# Patient Record
Sex: Female | Born: 1995 | Race: White | Hispanic: No | Marital: Single | Smoking: Never smoker
Health system: Southern US, Academic
[De-identification: ages and names within clinical notes are randomized; demographics above are authoritative.]

## PROBLEM LIST (undated history)

## (undated) DIAGNOSIS — F4310 Post-traumatic stress disorder, unspecified: Secondary | ICD-10-CM

## (undated) DIAGNOSIS — F319 Bipolar disorder, unspecified: Secondary | ICD-10-CM

## (undated) DIAGNOSIS — Z8701 Personal history of pneumonia (recurrent): Secondary | ICD-10-CM

## (undated) HISTORY — DX: Personal history of pneumonia (recurrent): Z87.01

---

## 2017-12-07 ENCOUNTER — Other Ambulatory Visit: Payer: Self-pay

## 2017-12-07 ENCOUNTER — Emergency Department (HOSPITAL_COMMUNITY)
Admission: EM | Admit: 2017-12-07 | Discharge: 2017-12-08 | Disposition: A | Discharge status: Home / Self Care | Payer: Managed Care, Other (non HMO) | Attending: Emergency Medicine | Admitting: Emergency Medicine

## 2017-12-07 ENCOUNTER — Emergency Department (HOSPITAL_COMMUNITY): Payer: Managed Care, Other (non HMO)

## 2017-12-07 DIAGNOSIS — R0602 Shortness of breath: Secondary | ICD-10-CM

## 2017-12-07 DIAGNOSIS — R079 Chest pain, unspecified: Secondary | ICD-10-CM | POA: Diagnosis present

## 2017-12-07 DIAGNOSIS — J189 Pneumonia, unspecified organism: Secondary | ICD-10-CM

## 2017-12-07 MED ORDER — IPRATROPIUM-ALBUTEROL 0.5-2.5 (3) MG/3ML IN SOLN
3.0000 mL | Freq: Once | RESPIRATORY_TRACT | Status: AC
  Administered 2017-12-07: 3 mL via RESPIRATORY_TRACT
  Filled 2017-12-07: qty 3

## 2017-12-07 MED ORDER — IBUPROFEN 200 MG PO TABS
600.0000 mg | ORAL_TABLET | Freq: Once | ORAL | Status: AC
  Administered 2017-12-07: 600 mg via ORAL
  Filled 2017-12-07: qty 3

## 2017-12-07 MED ORDER — ALBUTEROL SULFATE (2.5 MG/3ML) 0.083% IN NEBU: 5.0000 mg | INHALATION_SOLUTION | Freq: Once | RESPIRATORY_TRACT | Status: DC

## 2017-12-07 MED ORDER — SODIUM CHLORIDE 0.9 % IV BOLUS
1000.0000 mL | Freq: Once | INTRAVENOUS | Status: AC
  Administered 2017-12-08: 1000 mL via INTRAVENOUS

## 2017-12-07 NOTE — ED Provider Notes (Signed)
Care assumed from previous provider PA PenningtonMitchell. Please see note for further details. Case discussed, plan agreed upon.  Briefly, patient is a 22 year old female who presents to emergency department today for shortness of breath and pleuritic chest pain. Will follow up on pending lab work and reevaluate following fluids / neb treatment.  If all reassuring, likely discharged to home with symptomatic management instructions and to continue antibiotics given in urgent care yesterday.  Labs reviewed.  Leukocytosis of 12.7.  Elevated d-dimer at 1.21.  Discussed this with patient will proceed with CT scan.  CT angios with no pulmonary emboli, but does show multifocal airspace opacities pneumonia versus atelectasis.  Given symptoms, will treat for pneumonia. Patient on azithromycin prescribed by urgent care yesterday. Will add amoxil.   Strongly encouraged primary care follow-up.  Reasons to return to ER were discussed and all questions answered.   Ward, Chase PicketJaime Pilcher, PA-C 12/08/17 [id number]    Dione BoozeGlick, David, MD 12/08/17 [phone]

## 2017-12-07 NOTE — ED Triage Notes (Signed)
Patient states she was seen at Urgent care yesterday AM for SOB and chest pain with inhalation. Patient had a chest x-ray and was discharged with Antibiotics. Patient states the pain isnt any better and hurts worse when she take a deep breath.

## 2017-12-07 NOTE — Discharge Instructions (Addendum)
It was my pleasure taking care of you today!   Start antibiotic prescribed in ER tonight. Continue taking your antibiotic from urgent care as directed. Please take all of your antibiotics until finished!   You will need a repeat chest xray in 3 months for likely benign nodular opacity in the right middle lobe of the lung.  Return to ER for new or worsening symptoms, any additional concerns.

## 2017-12-07 NOTE — ED Notes (Addendum)
Clicked off EKG on accident, had no been preformed yet.

## 2017-12-07 NOTE — ED Provider Notes (Signed)
[facility]-EMERGENCY DEPT Provider Note   CSN: [id number] Arrival date & time: 12/07/17  1513     History   Chief Complaint Chief Complaint  Patient presents with  . Shortness of Breath  . Chest pain with Inhalation    HPI Laurie Krause is a 22 y.o. female with no significant past medical history presenting with sudden onset pleuritic chest pain and shortness of breath with dry cough yesterday.  Was seen in urgent care yesterday and prescribed a Z-Pak.  Reports subjective fevers and chills yesterday.  Today she reports that the pain has been worsening every time she takes a deep breath.  She does take oral contraceptives.  Denies any smoking history.  She has taken ibuprofen yesterday but nothing today for her symptoms are than her first dose of azithromycin. No history of DVT/PE, recent travel, prolonged immobilization, hemoptysis, lower extremity edema or calf pain.   HPI  No past medical history on file.  There are no active problems to display for this patient.   OB History   None      Home Medications    Prior to Admission medications   Medication Sig Start Date End Date Taking? Authorizing Provider  azithromycin (ZITHROMAX) 250 MG tablet Take 1 tablet by mouth as directed. 2 tablets on day 1, 1 tablet daily thereafter 09/06/17  Yes [provider]  JUNEL FE 1/20 1-20 MG-MCG tablet Take 1 tablet by mouth daily. 11/14/17  Yes [provider]    Family History No family history on file.  Social History Social History   Tobacco Use  . Smoking status: Not on file  Substance Use Topics  . Alcohol use: Not on file  . Drug use: Not on file     Allergies   Cefzil [cefprozil]   Review of Systems Review of Systems  Constitutional: Positive for chills and fever.  HENT: Negative for congestion and ear pain.   Eyes: Negative for pain and visual disturbance.  Respiratory: Positive for cough and shortness of breath. Negative  for choking, wheezing and stridor.   Cardiovascular: Positive for chest pain. Negative for palpitations.  Gastrointestinal: Negative for abdominal distention, abdominal pain, diarrhea, nausea and vomiting.  Genitourinary: Negative for difficulty urinating, dysuria and hematuria.  Musculoskeletal: Negative for arthralgias, back pain, myalgias, neck pain and neck stiffness.  Skin: Negative for color change, pallor and rash.  Neurological: Negative for dizziness, seizures, syncope, light-headedness and headaches.     Physical Exam Updated Vital Signs BP (!) 127/93 (BP Location: Left Arm)   Pulse (!) 113   Temp 98.9 F (37.2 C) (Oral)   Resp 16   Ht 5\' 11"  (1.803 m)   Wt 117.7 kg (259 lb 8 oz)   LMP 12/07/2017 (Exact Date)   SpO2 100%   BMI 36.19 kg/m   Physical Exam  Constitutional: She is oriented to person, place, and time. She appears well-developed and well-nourished.  Non-toxic appearance. She does not appear ill. No distress.  HENT:  Head: Normocephalic and atraumatic.  Mouth/Throat: Oropharynx is clear and moist.  Eyes: Conjunctivae and EOM are normal.  Neck: Normal range of motion. Neck supple.  Cardiovascular: Regular rhythm.  No murmur heard. Tachycardic  Pulmonary/Chest: Effort normal. No accessory muscle usage. No tachypnea and no bradypnea. No respiratory distress. She has decreased breath sounds. She has no wheezes. She has no rhonchi. She has no rales.  Lung sounds appear diminished bilaterally, may be due to poor effort due to pain on  inspiration.  Abdominal: She exhibits no distension.  Musculoskeletal: Normal range of motion. She exhibits no edema.       Right lower leg: Normal. She exhibits no tenderness and no edema.       Left lower leg: Normal. She exhibits no tenderness and no edema.  Neurological: She is alert and oriented to person, place, and time.  Skin: Skin is warm and dry. No ecchymosis and no rash noted. She is not diaphoretic. No erythema. No  pallor.  Psychiatric: She has a normal mood and affect.  Nursing note and vitals reviewed.    ED Treatments / Results  Labs (all labs ordered are listed, but only abnormal results are displayed) Labs Reviewed  D-DIMER, QUANTITATIVE (NOT AT [facility])  CBC  BASIC METABOLIC PANEL    EKG EKG Interpretation  Date/Time:  Thursday December 07 2017 23:06:15 EDT Ventricular Rate:  118 PR Interval:    QRS Duration: 77 QT Interval:  304 QTC Calculation: 426 R Axis:   34 Text Interpretation:  Sinus tachycardia Borderline T wave abnormalities No previous ECGs available Confirmed by Frederick PeersLittle, Rachel [phone]([id number]) on 12/07/2017 11:08:33 PM   Radiology Dg Chest 2 View  Result Date: 12/07/2017 CLINICAL DATA:  Shortness of breath and chest pain EXAM: CHEST - 2 VIEW COMPARISON:  None. FINDINGS: There is a 1.5 x 1.2 cm opacity in the right middle lobe. Lungs elsewhere are clear. Heart size and pulmonary vascularity are normal. No adenopathy. No bone lesions. No pneumothorax. IMPRESSION: No edema or consolidation. Nodular opacity right middle lobe, likely benign in this age group. As there are no prior studies to compare, a follow-up PA and lateral chest radiograph in 3 months to assess for stability would be warranted. No adenopathy evident. Electronically Signed   By: Bretta BangWilliam  Woodruff III M.D.   On: 12/07/2017 16:51    Procedures Procedures (including critical care time)  Medications Ordered in ED Medications  albuterol (PROVENTIL) (2.5 MG/3ML) 0.083% nebulizer solution 5 mg (5 mg Nebulization Not Given 12/07/17 1852)  ipratropium-albuterol (DUONEB) 0.5-2.5 (3) MG/3ML nebulizer solution 3 mL (has no administration in time range)  ibuprofen (ADVIL,MOTRIN) tablet 600 mg (has no administration in time range)  sodium chloride 0.9 % bolus 1,000 mL (has no administration in time range)     Initial Impression / Assessment and Plan / ED Course  I have reviewed the triage vital signs and the nursing  notes.  Pertinent labs & imaging results that were available during my care of the patient were reviewed by me and considered in my medical decision making (see chart for details).     Otherwise healthy 22 year old female presenting with sudden onset pleuritic chest pain, shortness of breath, tachycardia.  Discharge from urgent care yesterday with Z-Pak.  O2 sats dropping under 95% while she was talking to me and tachycardic.  Cannot use PERC, ordered dimer.   Patient is afebrile without the use of antipyretic.  Chest x-ray negative except for incidental nodular opacity of the right middle lobe. Discussed with patient and family.  Ordered basic labs to rule out anemia and EKG which showed sinus tach.  Patient care [facility] end of shift [facility] Ward, PA-C pending dimer and labs. Anticipate dc home if labs unremarkable and improvement in tachycardia after fluids. Final Clinical Impressions(s) / ED Diagnoses   Final diagnoses:  Chest pain, unspecified type  SOB (shortness of breath)    ED Discharge Orders    None       Mathews RobinsonsMitchell, Dhillon [place]  B, PA-C 12/07/17 2315    Melene Plan, DO 12/07/17 2331

## 2017-12-08 ENCOUNTER — Emergency Department (HOSPITAL_COMMUNITY): Payer: Managed Care, Other (non HMO)

## 2017-12-08 ENCOUNTER — Encounter (HOSPITAL_COMMUNITY): Payer: Self-pay

## 2017-12-08 LAB — CBC
HCT: 39.7 % (ref 36.0–46.0)
Hemoglobin: 13.5 g/dL (ref 12.0–15.0)
MCH: 30.6 pg (ref 26.0–34.0)
MCHC: 34 g/dL (ref 30.0–36.0)
MCV: 90 fL (ref 78.0–100.0)
Platelets: 266 10*3/uL (ref 150–400)
RBC: 4.41 MIL/uL (ref 3.87–5.11)
RDW: 14.6 % (ref 11.5–15.5)
WBC: 12.7 10*3/uL — ABNORMAL HIGH (ref 4.0–10.5)

## 2017-12-08 LAB — BASIC METABOLIC PANEL
Anion gap: 13 (ref 5–15)
BUN: 12 mg/dL (ref 6–20)
CALCIUM: 9.3 mg/dL (ref 8.9–10.3)
CO2: 24 mmol/L (ref 22–32)
CREATININE: 0.84 mg/dL (ref 0.44–1.00)
Chloride: 104 mmol/L (ref 101–111)
GFR calc Af Amer: >60 mL/min (ref >60)
GFR calc non Af Amer: >60 mL/min (ref >60)
GLUCOSE: 92 mg/dL (ref 65–99)
POTASSIUM: 3.4 mmol/L — AB (ref 3.5–5.1)
SODIUM: 141 mmol/L (ref 135–145)

## 2017-12-08 LAB — D-DIMER, QUANTITATIVE: D-Dimer, Quant: 1.21 ug/mL-FEU — ABNORMAL HIGH (ref 0.00–0.50)

## 2017-12-08 MED ORDER — IOPAMIDOL (ISOVUE-370) INJECTION 76%
100.0000 mL | Freq: Once | INTRAVENOUS | Status: AC | PRN
  Administered 2017-12-08: 100 mL via INTRAVENOUS

## 2017-12-08 MED ORDER — AMOXICILLIN 500 MG PO CAPS: 1000.0000 mg | ORAL_CAPSULE | Freq: Two times a day (BID) | ORAL | 0 refills | Status: DC

## 2017-12-08 MED ORDER — IOPAMIDOL (ISOVUE-370) INJECTION 76%
INTRAVENOUS | Status: AC
  Filled 2017-12-08: qty 100

## 2017-12-08 NOTE — ED Notes (Signed)
Verbalized understanding discharge instructions, prescriptions, and follow-up. In no acute distress.  School noted provided.

## 2017-12-11 NOTE — ED Provider Notes (Signed)
I spoke with pharmacist today.  Patient went to pick up her amoxicillin prescription and told the pharmacist that she was allergic to amoxicillin.  I reviewed her prior notes.  I called in doxycycline 100 mg p.o. twice daily as an alternative.   Linwood DibblesKnapp, Victoriya Pol, MD 12/11/17 1425

## 2019-05-02 IMAGING — CT CT ANGIO CHEST
2 of 6 series · 18 of 46 positions shown · IV contrast (ISOVUE 370)
Comparison: Chest radiograph 12/07/2017

CLINICAL DATA: Chest pain and shortness of breath

EXAM:
CT ANGIOGRAPHY CHEST WITH CONTRAST
TECHNIQUE: Multidetector CT imaging of the chest was performed using the
standard protocol during bolus administration of intravenous
contrast. Multiplanar CT image reconstructions and MIPs were
obtained to evaluate the vascular anatomy.
CONTRAST:  100mL K8JXOD-VE1 IOPAMIDOL (K8JXOD-VE1) INJECTION 76%

[Series 6: thins · axial · 0.62mm/px · z∈[+1530,+1729]mm · 15 of 219 slices shown]
[im 10/219  lung]
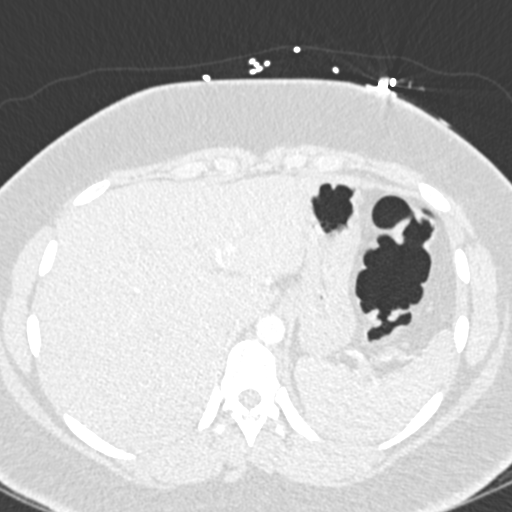
[im 29/219  soft-tissue]
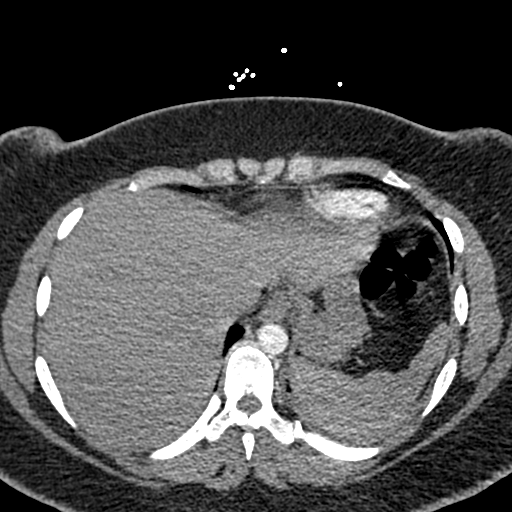
[im 38/219  lung]
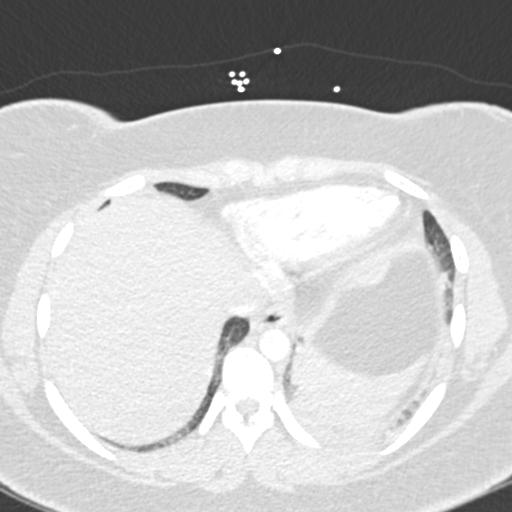
[im 57/219  soft-tissue]
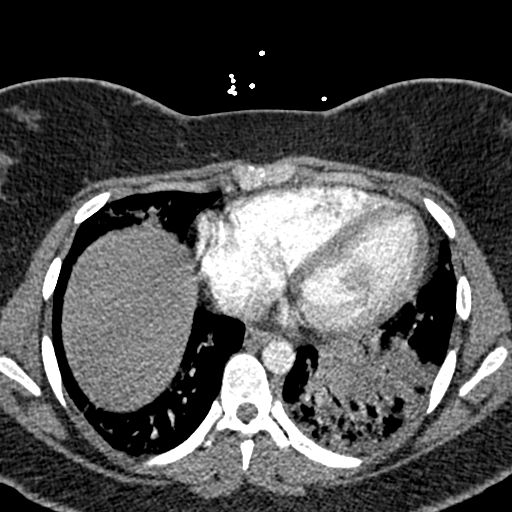
[im 67/219  lung]
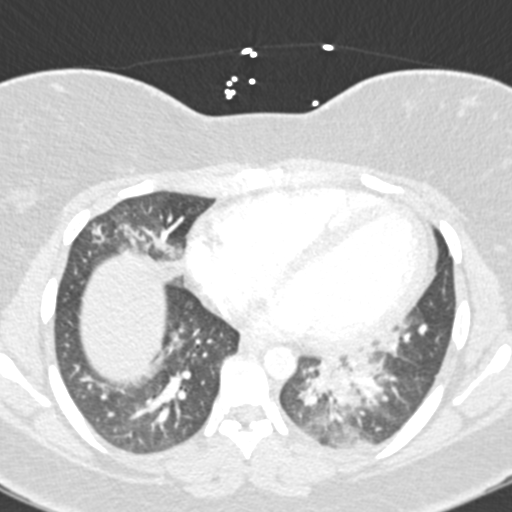
[im 86/219  soft-tissue]
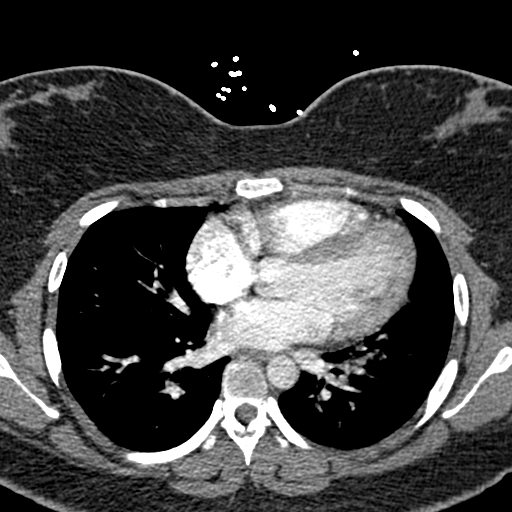
[im 95/219  lung]
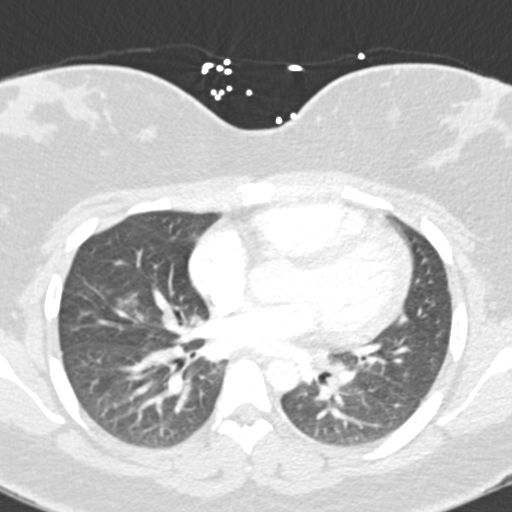
[im 114/219  soft-tissue]
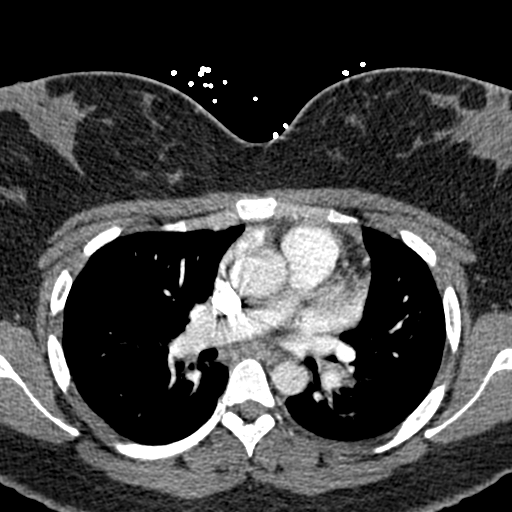
[im 124/219  lung]
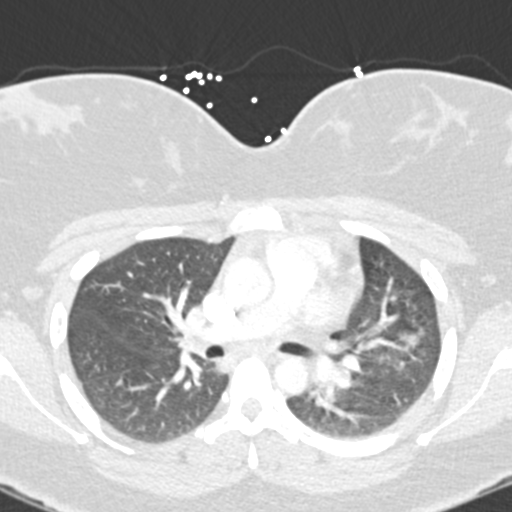
[im 133/219  soft-tissue]
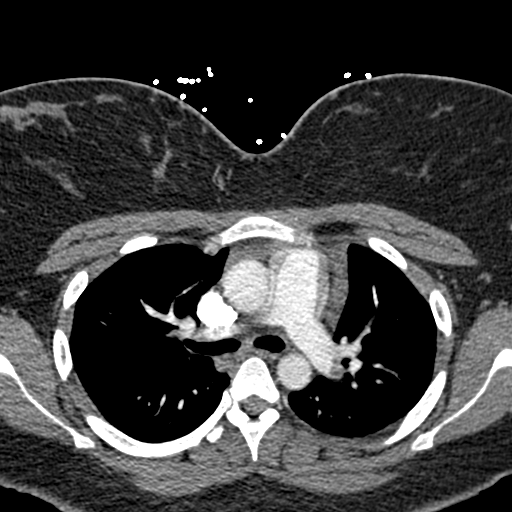
[im 152/219  lung]
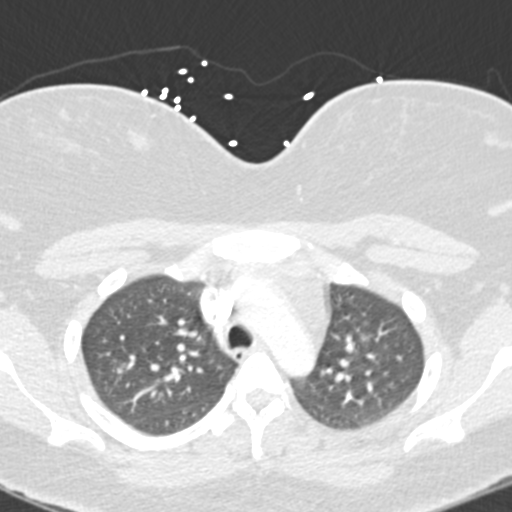
[im 162/219  soft-tissue]
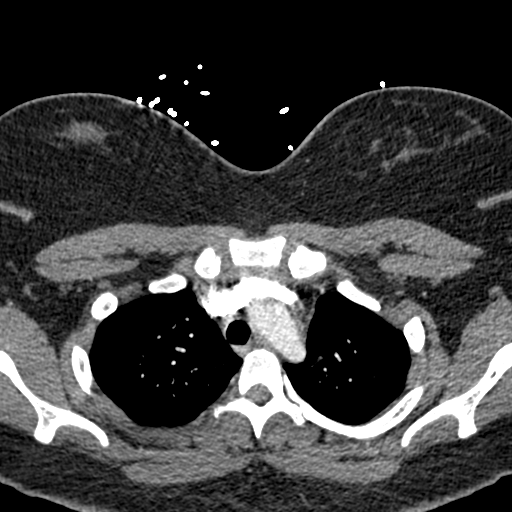
[im 181/219  lung]
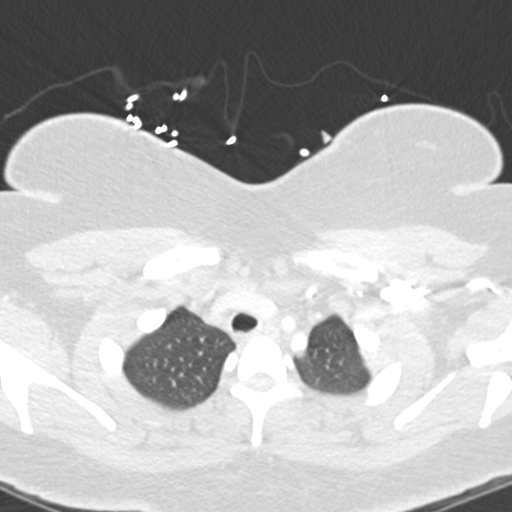
[im 190/219  soft-tissue]
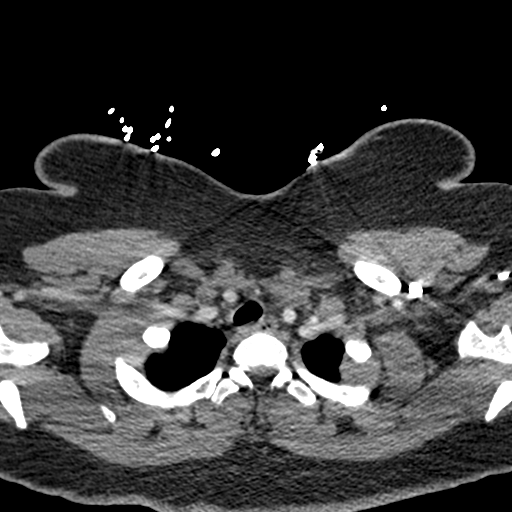
[im 209/219  lung]
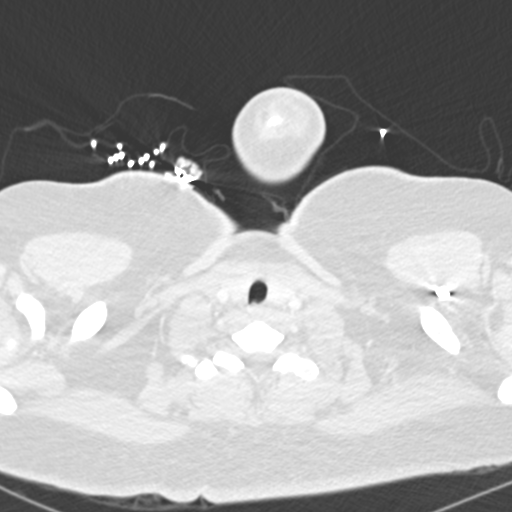

[Series 8: coronal mpr · coronal · 0.44mm/px · 3 of 121 slices shown]
[im 31/121  soft-tissue]
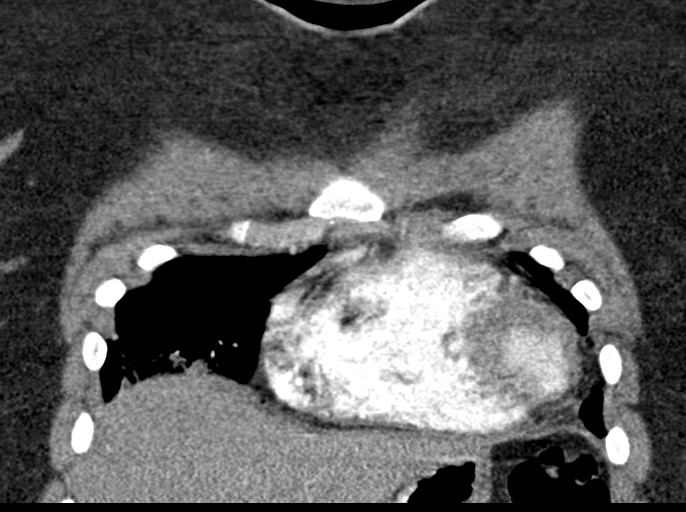
[im 61/121  soft-tissue]
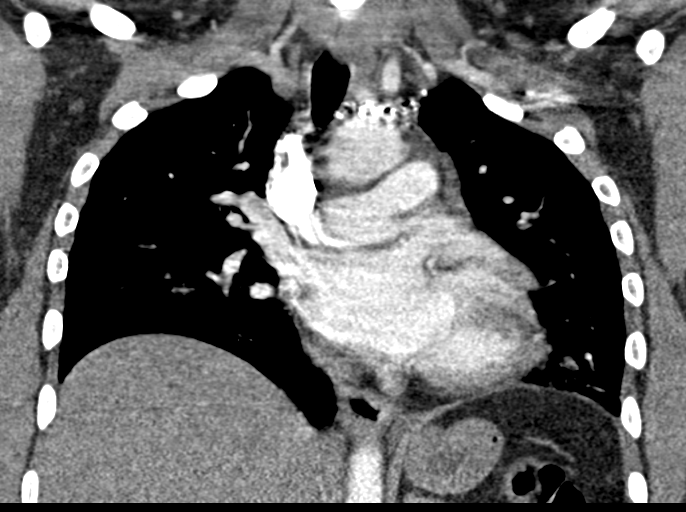
[im 91/121  soft-tissue]
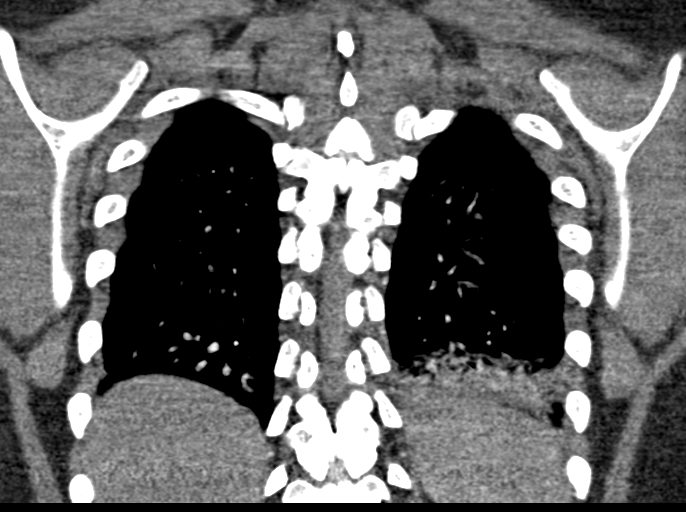

[18 of 46 positions shown; findings below may reference images not displayed]

FINDINGS: Cardiovascular: Contrast injection is sufficient to demonstrate
satisfactory opacification of the pulmonary arteries to the
segmental level. There is no pulmonary embolus. The main pulmonary
artery is within normal limits for size. There is a normal 3-vessel
arch branching pattern without evidence of acute aortic syndrome.
There is noaortic atherosclerosis. Heart size is normal, without
pericardial effusion.

Mediastinum/Nodes: No mediastinal, hilar or axillary
lymphadenopathy. The visualized thyroid and thoracic esophageal
course are unremarkable.

Lungs/Pleura: Multifocal airspace opacities in the right middle
lobe. Area of consolidation in the retrocardiac left lower lobe. No
pleural effusion or pneumothorax.

Upper Abdomen: Contrast bolus timing is not optimized for evaluation
of the abdominal organs. Within this limitation, the visualized
organs of the upper abdomen are normal.

Musculoskeletal: No chest wall abnormality. No acute or significant
osseous findings.

Review of the MIP images confirms the above findings.
IMPRESSION: 1. No pulmonary embolus.
2. Multifocal airspace opacities in the right middle and left lower
lobes, which may indicate pneumonia or atelectasis.

## 2019-08-06 ENCOUNTER — Other Ambulatory Visit: Payer: Self-pay

## 2019-08-06 ENCOUNTER — Ambulatory Visit (INDEPENDENT_AMBULATORY_CARE_PROVIDER_SITE_OTHER): Payer: Managed Care, Other (non HMO)

## 2019-08-06 ENCOUNTER — Ambulatory Visit (INDEPENDENT_AMBULATORY_CARE_PROVIDER_SITE_OTHER): Payer: Managed Care, Other (non HMO) | Admitting: Pulmonary Disease

## 2019-08-06 ENCOUNTER — Encounter: Payer: Self-pay | Admitting: Pulmonary Disease

## 2019-08-06 VITALS — BP 122/90 | HR 113 | Temp 98.3°F | Ht 71.25 in | Wt 257.2 lb

## 2019-08-06 DIAGNOSIS — Z8701 Personal history of pneumonia (recurrent): Secondary | ICD-10-CM | POA: Diagnosis not present

## 2019-08-06 DIAGNOSIS — M7989 Other specified soft tissue disorders: Secondary | ICD-10-CM | POA: Diagnosis not present

## 2019-08-06 NOTE — Progress Notes (Signed)
Subjective:   PATIENT ID: Laurie Krause GENDER: female DOB: 08-13-1996, MRN: [id number]   HPI  Chief Complaint  Patient presents with  . Consult    lung mass on CT april 2019,    Reason for Visit: New consult for abnormal chest imaging  Laurie Krause is a 23 year old female active smoker with no significant past medical history who presents as a new consult for CT follow-up.  She was last seen in the ED 12/08/18 . She presented with shortness of breath and pleuritic chest pain. CTA was negative for pulmonary emboli but did demonstrate multifocal pneumonia. She was treated with IVF, nebulizers and antibiotics. She was encouraged to follow-up with a doctor in a month but has not done so until now.  After her episode of pneumonia she had upper back pain with coughing that lasted a few months but this has resolved. Otherwise she has no respiratory complaints. Denies shortness of breath, cough, hemoptysis, wheezing or chest pain.   She does report periodic nodular swelling in her lower extremities that can vary in location in her upper and lower legs. This started in high school but has been occurring more frequently in the few months. The nodules are immobile, non-tender and non-erythematous and often located on the anterior aspect of her leg. The lesions are not preceded by any fever or infection and spontaneously resolve. Denies family history of autoimmune disease  Social History: Never tobacco smoker Has been smoking marijuana daily since 11/2018. Previously intermittently smoking weekly  I have personally reviewed patient's past medical/family/social history, allergies, current medications.  Past Medical History:  Diagnosis Date  . History of community acquired pneumonia      Family History  Problem Relation Age of Onset  . Diabetes Mother   . Hypertension Mother   . Diabetes Father   . Hypertension Father   . Healthy Sister   . Healthy Sister   . Throat cancer Paternal  Grandfather      Social History   Occupational History  . Not on file  Tobacco Use  . Smoking status: Never Smoker  . Smokeless tobacco: Never Used  Substance and Sexual Activity  . Alcohol use: Yes    Alcohol/week: 3.0 standard drinks    Types: 3 Standard drinks or equivalent per week    Comment: more on weekends sociallyh  . Drug use: Yes    Types: Marijuana  . Sexual activity: Not on file    Allergies  Allergen Reactions  . Cefzil [Cefprozil] Hives and Rash     Outpatient Medications Prior to Visit  Medication Sig Dispense Refill  . JUNEL FE 1/20 1-20 MG-MCG tablet Take 1 tablet by mouth daily.  3  . amoxicillin (AMOXIL) 500 MG capsule Take 2 capsules (1,000 mg total) by mouth 2 (two) times daily. 20 capsule 0  . azithromycin (ZITHROMAX) 250 MG tablet Take 1 tablet by mouth as directed. 2 tablets on day 1, 1 tablet daily thereafter  0   No facility-administered medications prior to visit.     Review of Systems  Constitutional: Negative for chills, diaphoresis, fever, malaise/fatigue and weight loss.  HENT: Negative for congestion, ear pain and sore throat.   Respiratory: Negative for cough, hemoptysis, sputum production, shortness of breath and wheezing.   Cardiovascular: Positive for leg swelling. Negative for chest pain and palpitations.  Gastrointestinal: Negative for abdominal pain, heartburn and nausea.  Genitourinary: Negative for frequency.  Musculoskeletal: Negative for joint pain and myalgias.  Skin:  Negative for itching and rash.  Neurological: Negative for dizziness, weakness and headaches.  Endo/Heme/Allergies: Does not bruise/bleed easily.  Psychiatric/Behavioral: Positive for depression. The patient is nervous/anxious.     Objective:   Vitals:   08/06/19 1036  BP: 122/90  Pulse: (!) 113  Temp: 98.3 F (36.8 C)  TempSrc: Temporal  SpO2: 96%  Weight: 257 lb 3.2 oz (116.7 kg)  Height: 5' 11.25" (1.81 m)   SpO2: 96 % O2 Device: None (Room  air) Physical Exam: General: Well-appearing, no acute distress HENT: [place], AT, Eyes: EOMI, no scleral icterus Respiratory: Clear to auscultation bilaterally.  No crackles, wheezing or rales Cardiovascular: RRR, -M/R/G, no JVD GI: BS+, soft, nontender Extremities:-Edema,-tenderness Neuro: AAO x4, CNII-XII grossly intact Skin: Intact, no rashes or bruising Psych: Normal mood, normal affect  Data Reviewed:  Imaging: CXR 08/06/19 -   Imaging, labs and tests noted above have been reviewed independently by me.    Assessment & Plan:   Discussion: 23 year old female with hx of community-acquired pneumonia who presents for CT chest follow-up. Currently asymptomatic.   Mutifocal Pneumonia Previously seen on chest imaging. Repeat CXR today with resolution of abnormalities. --No further imaging recommended  Leg Swelling --Take a picture of these spots when you can --Try to note when these lesions appear and if it is associated with fevers or infection --Establish with a primary care doctor  Health Maintenance  There is no immunization history on file for this patient.  Declined influenza vaccine  Orders Placed This Encounter  Procedures  . DG Chest 2 View    Standing Status:   Future    Number of Occurrences:   1    Standing Expiration Date:   10/06/2020    Order Specific Question:   Reason for Exam (SYMPTOM  OR DIAGNOSIS REQUIRED)    Answer:   history of pna    Order Specific Question:   Preferred imaging location?    Answer:   Internal    Order Specific Question:   Radiology Contrast Protocol - do NOT remove file path    Answer:   [email or web address]  No orders of the defined types were placed in this encounter.   Return if symptoms worsen or fail to improve.  Charvis Lightner Mechele Collin, MD [facility] 08/06/2019 12:13 PM  Office Number [phone]

## 2019-08-06 NOTE — Patient Instructions (Signed)
Mutifocal Pneumonia Previously seen on chest imaging. Repeat CXR today with resolution of abnormalities. --No further imaging recommended  Leg Swelling --Take a picture of these spots when you can --Try to note when these lesions appear and if it is associated with fevers or infection --Establish with a primary care doctor

## 2021-04-29 ENCOUNTER — Emergency Department
Admission: EM | Admit: 2021-04-29 | Discharge: 2021-04-30 | Disposition: A | Discharge status: Home / Self Care | Payer: Medicaid Other | Attending: Medical | Admitting: Medical

## 2021-04-29 ENCOUNTER — Other Ambulatory Visit: Payer: Self-pay

## 2021-04-29 ENCOUNTER — Encounter (HOSPITAL_COMMUNITY): Payer: Self-pay

## 2021-04-29 DIAGNOSIS — F319 Bipolar disorder, unspecified: Secondary | ICD-10-CM | POA: Insufficient documentation

## 2021-04-29 DIAGNOSIS — F419 Anxiety disorder, unspecified: Secondary | ICD-10-CM

## 2021-04-29 DIAGNOSIS — Z1339 Encounter for screening examination for other mental health and behavioral disorders: Secondary | ICD-10-CM

## 2021-04-29 HISTORY — DX: Post-traumatic stress disorder, unspecified: F43.10

## 2021-04-29 HISTORY — DX: Bipolar disorder, unspecified (CMS HCC): F31.9

## 2021-04-29 LAB — ECG 12 LEAD
Calculated P Axis: 68 degrees
Calculated T Axis: 44 degrees
PR Interval: 160 ms
QRS Duration: 96 ms
QTC Calculation: 432 ms
Ventricular rate: 92 {beats}/min

## 2021-04-29 LAB — COMPREHENSIVE METABOLIC PANEL, NON-FASTING
ALBUMIN: 3.8 g/dL (ref 3.5–5.0)
ALKALINE PHOSPHATASE: 73 U/L (ref 40–110)
ALT (SGPT): 17 U/L (ref 8–22)
ANION GAP: 11 mmol/L (ref 4–13)
AST (SGOT): 16 U/L (ref 8–45)
BILIRUBIN TOTAL: 0.5 mg/dL (ref 0.3–1.3)
BUN/CREA RATIO: 12 (ref 6–22)
BUN: 9 mg/dL (ref 8–25)
CALCIUM: 9.5 mg/dL (ref 8.5–10.0)
CHLORIDE: 106 mmol/L (ref 96–111)
CO2 TOTAL: 23 mmol/L (ref 22–30)
CREATININE: 0.73 mg/dL (ref 0.60–1.05)
ESTIMATED GFR: >90 mL/min/BSA (ref >=60)
GLUCOSE: 95 mg/dL (ref 65–125)
POTASSIUM: 3.7 mmol/L (ref 3.5–5.1)
PROTEIN TOTAL: 7.1 g/dL (ref 6.4–8.3)
SODIUM: 140 mmol/L (ref 136–145)

## 2021-04-29 LAB — URINALYSIS, MACROSCOPIC
BILIRUBIN: NOT DETECTED mg/dL
BLOOD: NOT DETECTED mg/dL
GLUCOSE: NOT DETECTED mg/dL
KETONES: NOT DETECTED mg/dL
LEUKOCYTES: NOT DETECTED WBCs/uL
NITRITE: NOT DETECTED
PH: 7 (ref 5.0–?)
PROTEIN: NOT DETECTED mg/dL
SPECIFIC GRAVITY: 1.02 (ref 1.005–?)
UROBILINOGEN: 1 mg/dL — AB

## 2021-04-29 LAB — CBC
HCT: 42.7 % (ref 34.8–46.0)
HGB: 14.3 g/dL (ref 11.5–16.0)
MCH: 30.8 pg (ref 26.0–32.0)
MCHC: 33.5 g/dL (ref 31.0–35.5)
MCV: 91.8 fL (ref 78.0–100.0)
MPV: 9.6 fL (ref 8.7–12.5)
PLATELETS: 185 10*3/uL (ref 150–400)
RBC: 4.65 10*6/uL (ref 3.85–5.22)
RDW-CV: 12.1 % (ref 11.5–15.5)
WBC: 8.2 10*3/uL (ref 3.7–11.0)

## 2021-04-29 LAB — ETHANOL, SERUM: ETHANOL: NOT DETECTED

## 2021-04-29 LAB — HCG, URINE QUALITATIVE, PREGNANCY: HCG URINE QUALITATIVE: NOT DETECTED

## 2021-04-29 MED ORDER — LORAZEPAM 1 MG TABLET
1.0000 mg | ORAL_TABLET | ORAL | Status: AC
Start: 2021-04-29 — End: 2021-04-29
  Administered 2021-04-29: 1 mg via ORAL
  Filled 2021-04-29: qty 1

## 2021-04-29 NOTE — ED Triage Notes (Signed)
Patient states she has been having a lot of trouble with her emotions for the last month. States history of bipolar and PTSD. Denies being suicidal or homicidal.

## 2021-04-29 NOTE — ED Provider Notes (Signed)
Emergency Department Provider Note  HPI - 04/29/2021    COVID-19 PANDEMIC IN EFFECT    History of Present Illness      Lydia Salazar 25 y.o. female  Date of Birth: 1996-05-18     Attending: Dr. Merceda ElksJoseph Imani Salazar    PCP: No Pcp      Chief Complaint   Patient presents with    Depression       Initial ED provider evaluation performed at 2210    History Provided by: Patient      HPI:  Lydia Salazar is a 25 y.o. female who presents to the ED today for anxiety. Patient arrives to the ED via private transportation. Pt reports that she has been having worsening anxiety for the last month. She states that she has racing thoughts and that they're overwhelming. Pt denies any HI or SI. She relays that she was recently diagnosed with bipolar 1 disorder. She endorses she has been taking Vraylar, but notes that they haven't improved her symptoms. Patient denies fever, chills and audiovisual hallucinations. Please see nurses note for complete medical/surgical history and allergy list. Pt has no other concerns or complaints at time of initial ED provider evaluation.    Location: [facility]  Quality: Anxety  Onset: x1 month  Timing: Worsening  Context: See HPI  Modifying Factors: She endorses she has been taking Vraylar, but notes that they haven't improved her symptoms.  Associated Symptoms: Anxiety    Review of Systems     Review of Systems:  Constitutional: No fever or chills. No malaise/fatigue.  Skin: No rashes or itching.  HENT: No head injury. No sore throat, ear pain, ear discharge, or difficulty swallowing.  Eyes: No vision changes, redness, or eye discharge.  Cardiovascular: No chest pain or palpitations. No leg swelling.  Respiratory: No cough or SOB.  GI/abdomen: No abdominal pain. No nausea or vomiting. No diarrhea or constipation.  GU:  No dysuria or hematuria. No decreased urine output.  MSK/Extremities: No neck or back pain. No extremity pain.   Neuro: No headache. No dizziness, speech change or LOC.   Psych: Anxiety. No SI,  HI or substance abuse.  All other systems reviewed and are negative, unless commented on in the HPI.      Past Medical History     Filed Vitals:    04/29/21 2214 04/30/21 0257   BP: 127/88 121/78   Pulse: 95 98   Resp: 18 16   Temp: 36.3 C (97.4 F) 36.7 C (98 F)   SpO2: 100% 100%       PMHx:    Medical History     Diagnosis Date Comment Source    Bipolar 1 disorder (CMS HCC)       PTSD (post-traumatic stress disorder)            Allergies:    No Known Allergies    Social History  Social History     Tobacco Use    Smoking status: Never Smoker    Smokeless tobacco: Never Used   Substance Use Topics    Alcohol use: Never    Drug use: Never       Family History  Family Medical History:    None        Home Meds:   No current facility-administered medications for this encounter.    Current Outpatient Medications:     cariprazine HCl (VRAYLAR ORAL), Take by mouth, Disp: , Rfl:     hydrOXYzine pamoate (VISTARIL) 25 mg Oral  Capsule, Take 1 Capsule (25 mg total) by mouth Three times a day as needed for Itching or Anxiety, Disp: 12 Capsule, Rfl: 0          Exam and Objective Findings     Physical Exam  Nursing note and vitals reviewed.  Vital signs reviewed as above.     Constitutional: Pt is awake, alert, and in no acute distress.   Skin: Warm and dry. No rash or lesions.  HENT: Atraumatic. Moist mucous membranes.  Eyes: Conjunctivae are normal.   Cardiovascular: Regular rate. Normal rhythm. Distal pulses intact bilaterally.   Respiratory: Breath sounds normal. Effort normal. No respiratory distress.   GI/abdomen: Abdomen is soft, nontender, nondistended. Bowel sounds are normal. No rebound, guarding, or masses.   MSK/Extremities: Normal range of motion. No tenderness or erythema. 5/5 strength throughout.  Neurological: Patient is alert and oriented to person, place and time.   Psychiatric: Patient has a normal mood and affect.     Orders     Work-up:  Orders Placed This Encounter    CBC    COMPREHENSIVE METABOLIC PANEL,  NON-FASTING    ETHANOL, SERUM    DRUG SCREEN, WITH CONFIRMATION, URINE    URINALYSIS, MACROSCOPIC    HCG, URINE QUALITATIVE, PREGNANCY    ECG 12 LEAD    LORazepam (ATIVAN) tablet    hydrOXYzine pamoate (VISTARIL) 25 mg Oral Capsule        Labs     Labs:   Results for orders placed or performed during the hospital encounter of 04/29/21 (from the past 24 hour(s))   CBC   Result Value Ref Range    WBC 8.2 3.7 - 11.0 x103/uL    RBC 4.65 3.85 - 5.22 x106/uL    HGB 14.3 11.5 - 16.0 g/dL    HCT 65.7 84.6 - 96.2 %    MCV 91.8 78.0 - 100.0 fL    MCH 30.8 26.0 - 32.0 pg    MCHC 33.5 31.0 - 35.5 g/dL    RDW-CV 95.2 84.1 - 32.4 %    PLATELETS 185 150 - 400 x103/uL    MPV 9.6 8.7 - 12.5 fL   COMPREHENSIVE METABOLIC PANEL, NON-FASTING   Result Value Ref Range    SODIUM 140 136 - 145 mmol/L    POTASSIUM 3.7 3.5 - 5.1 mmol/L    CHLORIDE 106 96 - 111 mmol/L    CO2 TOTAL 23 22 - 30 mmol/L    ANION GAP 11 4 - 13 mmol/L    BUN 9 8 - 25 mg/dL    CREATININE 4.01 0.27 - 1.05 mg/dL    BUN/CREA RATIO 12 6 - 22    ESTIMATED GFR >90 >=60 mL/min/BSA    ALBUMIN 3.8 3.5 - 5.0 g/dL     CALCIUM 9.5 8.5 - 25.3 mg/dL    GLUCOSE 95 65 - 664 mg/dL    ALKALINE PHOSPHATASE 73 40 - 110 U/L    ALT (SGPT) 17 8 - 22 U/L    AST (SGOT)  16 8 - 45 U/L    BILIRUBIN TOTAL 0.5 0.3 - 1.3 mg/dL    PROTEIN TOTAL 7.1 6.4 - 8.3 g/dL   ETHANOL, SERUM   Result Value Ref Range    ETHANOL None Detected    DRUG SCREEN, WITH CONFIRMATION, URINE   Result Value Ref Range    AMPHETAMINES, URINE Negative Negative    BARBITURATES URINE Negative Negative    BENZODIAZEPINES URINE Negative Negative    BUPRENORPHINE URINE Negative  Negative    CANNABINOIDS URINE Negative Negative    COCAINE METABOLITES URINE Negative Negative    METHADONE URINE Negative Negative    OPIATES URINE (LOW CUTOFF) Negative Negative    OXYCODONE URINE Negative Negative    ECSTASY/MDMA URINE Negative Negative    FENTANYL, RANDOM URINE Negative Negative    CREATININE RANDOM URINE 178 (H) 50 - 100 mg/dL    URINALYSIS, MACROSCOPIC   Result Value Ref Range    COLOR Yellow Colorless, Straw, Yellow    APPEARANCE Clear Clear    SPECIFIC GRAVITY 1.020 >1.005 - <1.030    PH 7.0 >5.0 - <8.0    PROTEIN Not Detected Not Detected mg/dL    GLUCOSE Not Detected Not Detected mg/dL    KETONES Not Detected Not Detected mg/dL    UROBILINOGEN 1.0 (A) Not Detected, 0.2  mg/dL    BILIRUBIN Not Detected Not Detected mg/dL    BLOOD Not Detected Not Detected mg/dL    NITRITE Not Detected Not Detected    LEUKOCYTES Not Detected Not Detected WBCs/uL   HCG, URINE QUALITATIVE, PREGNANCY   Result Value Ref Range    HCG URINE QUALITATIVE Not detected Not detected       Abnormal Lab results:  Labs Reviewed   DRUG SCREEN, WITH CONFIRMATION, URINE - Abnormal; Notable for the following components:       Result Value    CREATININE RANDOM URINE 178 (*)     All other components within normal limits   URINALYSIS, MACROSCOPIC - Abnormal; Notable for the following components:    UROBILINOGEN 1.0 (*)     All other components within normal limits   CBC - Normal   COMPREHENSIVE METABOLIC PANEL, NON-FASTING - Normal   HCG, URINE QUALITATIVE, PREGNANCY - Normal   ETHANOL, SERUM     EKG's     Most Recent EKG This Encounter   ECG 12 LEAD    Collection Time: 04/29/21 10:54 PM   Result Value    Ventricular rate 92    Atrial Rate 92    PR Interval 160    QRS Duration 96    QT Interval 350    QTC Calculation 432    Calculated P Axis 68    Calculated R Axis 24    Calculated T Axis 44    Narrative    Normal sinus rhythm  Normal ECG  NO STEMI     Reviewed by Merceda Elks DO     Confirmed by [place] Faster [phone]), editor French Ana [phone]) on 04/30/2021 2:21:34 AM     Assessment & Plan     Plan: Appropriate testing. Medical Records reviewed.    MDM:   During the patient's stay in the emergency department, the above listed testing was performed to assist with medical decision making, and were reviewed by myself when available for review.   Pt remained stable  throughout the emergency department course.           Disposition     Impression:   Diagnosis       Diagnosis Comment Added By Time Added    Bipolar disorder with depression (CMS HCC)  Merceda Elks, DO 04/30/2021 12:47 AM          Disposition:  Discharged    Discussed results, diagnosis, and treatment with the patient.  It was advised that the patient return to the ED with any new, concerning, or worsening symptoms, and follow up as directed.   The patient verbalized understanding  of all instructions and had no further questions or concerns.     Follow up:     Keep your appointment with your mental health provider on tuesday.        Attestations     I am scribing for, and in the presence of, Dr. Merceda Elks for services provided on 04/29/2021.  French Ana, SCRIBE    [place], SCRIBE 04/29/2021, 22:53      I personally performed the services described in this documentation, as scribed in my presence, and it is both accurate and complete   Merceda Elks, DO   Emergency Medicine       This note may have been partially generated using MModal Fluency Direct system, and there may be some incorrect words, spellings, and punctuation that were not noted in checking the chart before saving.

## 2021-04-29 NOTE — Behavioral Health (Addendum)
[facility] Intake [facility] Screening      Date:04/29/2021  Lydia Salazar, 25 y.o., female  Date of Birth: 01-05-1996  Address:   Phone number:   Home Phone [phone]   Work Phone [phone]   Mobile [phone]     Emergency contact: No emergency contact information on file.  Emergency contact phone number:    Occupation:unemployed  Marital Status:Single  PCP: No Pcp    Insurance Information:  Military History:  none  [employer] Benefits: none    Legal Status:  Legal Charges(if Yes explain):no     PRESENTING PROBLEM:  "I, today has been really tough day.  I don't know how to cope with my emotions.  Diagnosed with Bipolar I, PTSD from childhood trauma. I take Vraylar. I did stop it because it made me tired.  Then started back on it last 2 weeks.  Little things can set me off.  I'll start freaking out, yelling."  (Denies any S.I., H.I., psychosis.  Patient states she feels safe to return home to her S/O and children.  [facility] is her outpatient provider, next appointment is this Tuesday.)    REPORTED SYMPTOMS(sleep, appetite, energy, reckless, impulsive, anxiety etc):     Suicidal: Denies  Recent Attempt/Gesture(when/what): 2021; attempted hanging, just hung the sheet up.  Wasn't hospitalized.  Access to Availability:  History of Suicide Behavior(Explain): yes  Has the patient managed to avoid suicidal acting out in the past: yes  Family History of Suicide: no   If yes, then who and when:     Violence Risk: Denies  Identified Target:  History or Violence(describe):  Self-Injurious Behavior (what/how often/last time):    Legal History(Current Status/ arrests/ convictions):      PSYCHOSIS:No                          If Yes to any describe:    SUBSTANCE ABUSE HISTORY:        Substance   Duration/Pattern of use       Amount Used       Last Use                                                                                       HISTORY OF:  Substance Related Legal Issues:    MEDICAL  ISSUES/HISTORY:   Ambulation:Independent    Current Lab Abnormalities:  Pregnancy test:   Drug Screen Positives:   BAL:    Current Vital Signs:  Filed Vitals:    04/29/21 2214   BP: 127/88   Pulse: 95   Resp: 18   Temp: 36.3 C (97.4 F)   SpO2: 100%       HISTORY OF MRSA/VRE:no   If yes, site:  , treatment:    MEDICAL HISTORY/ACTIVE MEDICAL PROBLEMS:  Past Medical History:   Diagnosis Date   . Bipolar 1 disorder (CMS HCC)    . PTSD (post-traumatic stress disorder)                    MEDICATIONS:  Pharmacy:   Preferred Pharmacy  None        No Known Allergies   Medications Prior to Admission     Prescriptions    cariprazine HCl (VRAYLAR ORAL)    Take by mouth           Prescribers of Medications: [facility]        MENTAL STATUS EXAMINATION:    Attitude:Cooperative  Eye contact:Consistent  Self concept:Intact  ADL's/Self care: good  Consciousness: Alert  Appearance:Appropriate  Orientation:Time, Place, Person and Situation  Speech:Regular/Rate/Rhythm  Mood:WNL  Affect:Appropriate  Thought:Relevant/Coherent  Memory:WNL  Judgement/Insight:Fair  Perception:WNL  Motor activity:WNL      PREVIOUS PSYCHIATRIC HISTORY:   Previous History: none           If  Yes Diagnosis:     Previous Inpatient Admissions/commitments: No            Facility               Date                 Reason                      OUTPATIENT PROVIDER:   Active: yes    Severity Risk:Low    Disposition: Initial Diagnostic Impression(Per Physician):   Physician/s Consulted:  Caleb Popp - PA  Accepted to:  Admit to Dr.:  Refer for medical clearance:    Refer for involuntary commitment:  Referred to Outpatient: [facility]  Referred other Inpatient:       Where:  Denied(if denied, reason):  Admission Criteria Not Met: not met  Other/Comments:         Vonzella Nipple, MHS   04/29/2021

## 2021-04-30 LAB — DRUG SCREEN, WITH CONFIRMATION, URINE
AMPHETAMINES, URINE: NEGATIVE
BARBITURATES URINE: NEGATIVE
BENZODIAZEPINES URINE: NEGATIVE
BUPRENORPHINE URINE: NEGATIVE
CANNABINOIDS URINE: NEGATIVE
COCAINE METABOLITES URINE: NEGATIVE
CREATININE RANDOM URINE: 178 mg/dL — ABNORMAL HIGH (ref 50–100)
ECSTASY/MDMA URINE: NEGATIVE
FENTANYL, RANDOM URINE: NEGATIVE
METHADONE URINE: NEGATIVE
OPIATES URINE (LOW CUTOFF): NEGATIVE
OXYCODONE URINE: NEGATIVE

## 2021-04-30 LAB — ECG 12 LEAD
Atrial Rate: 92 {beats}/min
Calculated R Axis: 24 degrees
QT Interval: 350 ms

## 2021-04-30 MED ORDER — HYDROXYZINE PAMOATE 25 MG CAPSULE
25.0000 mg | ORAL_CAPSULE | Freq: Three times a day (TID) | ORAL | 0 refills | Status: AC | PRN
Start: 2021-04-30 — End: ?

## 2021-05-09 ENCOUNTER — Encounter (HOSPITAL_COMMUNITY): Payer: Self-pay

## 2021-05-09 ENCOUNTER — Emergency Department
Admission: EM | Admit: 2021-05-09 | Discharge: 2021-05-10 | Disposition: A | Discharge status: Home / Self Care | Payer: Medicaid Other | Attending: Medical | Admitting: Medical

## 2021-05-09 ENCOUNTER — Other Ambulatory Visit: Payer: Self-pay

## 2021-05-09 DIAGNOSIS — F32A Depression, unspecified: Secondary | ICD-10-CM

## 2021-05-09 DIAGNOSIS — F4310 Post-traumatic stress disorder, unspecified: Secondary | ICD-10-CM | POA: Insufficient documentation

## 2021-05-09 DIAGNOSIS — F319 Bipolar disorder, unspecified: Secondary | ICD-10-CM | POA: Insufficient documentation

## 2021-05-09 DIAGNOSIS — R45851 Suicidal ideations: Secondary | ICD-10-CM

## 2021-05-09 DIAGNOSIS — Z8659 Personal history of other mental and behavioral disorders: Secondary | ICD-10-CM | POA: Insufficient documentation

## 2021-05-09 LAB — URINALYSIS, MACROSCOPIC
BILIRUBIN: NOT DETECTED mg/dL
BLOOD: NOT DETECTED mg/dL
GLUCOSE: NOT DETECTED mg/dL
KETONES: NOT DETECTED mg/dL
LEUKOCYTES: NOT DETECTED WBCs/uL
NITRITE: NOT DETECTED
PH: 6.5 (ref 5.0–?)
PROTEIN: NOT DETECTED mg/dL
SPECIFIC GRAVITY: 1.025 (ref 1.005–?)
UROBILINOGEN: 1 mg/dL — AB

## 2021-05-09 LAB — COMPREHENSIVE METABOLIC PANEL, NON-FASTING
ALBUMIN: 3.6 g/dL (ref 3.5–5.0)
ALKALINE PHOSPHATASE: 70 U/L (ref 40–110)
ALT (SGPT): 13 U/L (ref 8–22)
ANION GAP: 8 mmol/L (ref 4–13)
AST (SGOT): 14 U/L (ref 8–45)
BILIRUBIN TOTAL: 0.6 mg/dL (ref 0.3–1.3)
BUN/CREA RATIO: 9 (ref 6–22)
BUN: 7 mg/dL — ABNORMAL LOW (ref 8–25)
CALCIUM: 9.2 mg/dL (ref 8.5–10.0)
CHLORIDE: 109 mmol/L (ref 96–111)
CO2 TOTAL: 21 mmol/L — ABNORMAL LOW (ref 22–30)
CREATININE: 0.75 mg/dL (ref 0.60–1.05)
ESTIMATED GFR: >90 mL/min/BSA (ref >=60)
GLUCOSE: 119 mg/dL (ref 65–125)
POTASSIUM: 3.7 mmol/L (ref 3.5–5.1)
PROTEIN TOTAL: 6.8 g/dL (ref 6.4–8.3)
SODIUM: 138 mmol/L (ref 136–145)

## 2021-05-09 LAB — ECG 12 LEAD
Atrial Rate: 92 {beats}/min
Calculated P Axis: 67 degrees
Calculated R Axis: 18 degrees
Calculated T Axis: 42 degrees
PR Interval: 152 ms
QRS Duration: 100 ms
QT Interval: 350 ms
QTC Calculation: 432 ms
Ventricular rate: 92 {beats}/min

## 2021-05-09 LAB — CBC
HCT: 40.7 % (ref 34.8–46.0)
HGB: 13.8 g/dL (ref 11.5–16.0)
MCH: 31.2 pg (ref 26.0–32.0)
MCHC: 33.9 g/dL (ref 31.0–35.5)
MCV: 91.9 fL (ref 78.0–100.0)
MPV: 9.5 fL (ref 8.7–12.5)
PLATELETS: 210 10*3/uL (ref 150–400)
RBC: 4.43 10*6/uL (ref 3.85–5.22)
RDW-CV: 12.2 % (ref 11.5–15.5)
WBC: 7.9 10*3/uL (ref 3.7–11.0)

## 2021-05-09 LAB — DRUG SCREEN, WITH CONFIRMATION, URINE
AMPHETAMINES, URINE: NEGATIVE
BARBITURATES URINE: NEGATIVE
BENZODIAZEPINES URINE: NEGATIVE
BUPRENORPHINE URINE: NEGATIVE
CANNABINOIDS URINE: NEGATIVE
COCAINE METABOLITES URINE: NEGATIVE
CREATININE RANDOM URINE: 184 mg/dL — ABNORMAL HIGH (ref 50–100)
ECSTASY/MDMA URINE: NEGATIVE
FENTANYL, RANDOM URINE: NEGATIVE
METHADONE URINE: NEGATIVE
OPIATES URINE (LOW CUTOFF): NEGATIVE
OXYCODONE URINE: NEGATIVE

## 2021-05-09 LAB — ETHANOL, SERUM: ETHANOL: NOT DETECTED

## 2021-05-09 LAB — HCG, URINE QUALITATIVE, PREGNANCY: HCG URINE QUALITATIVE: NOT DETECTED

## 2021-05-09 MED ORDER — DIPHENHYDRAMINE 25 MG CAPSULE
25.0000 mg | ORAL_CAPSULE | ORAL | Status: AC
Start: 2021-05-09 — End: 2021-05-09
  Administered 2021-05-09 (×2): 25 mg via ORAL
  Filled 2021-05-09: qty 1

## 2021-05-09 NOTE — ED Triage Notes (Addendum)
Pt reports having SI with no plan and denies HI at this time. Pt says she found out three days ago she is pregnant.

## 2021-05-09 NOTE — ED Provider Notes (Signed)
Emergency Department  Provider Note  HPI - 05/09/2021    Name: Lydia Salazar  Age and Gender: 25 y.o. female  Attending: Dr. Merceda Elks  PCP: No Pcp    COVID-19 Pandemic in Progress:    HPI:  Lydia Salazar is a 25 y.o. female  who presents to the ED today for evaluation of suicidal ideations. Patient arrives to ED via private vehicle with grandmother at bedside. History of bipolar 1 disorder and PTSD. Per chart review, patient was evaluated in the ED on 8/25 for depression. She was discharged home with a prescription for Hydroxyzine. Patient presents tonight reports suicidal ideations with no plan x2 days. She feels depressed. Patient states "I don't want to be alive anymore. It's hard to deal with these racing thoughts." Denies HI or hallucinations. She lives at home with her boyfriend and 2 other children. Patient recently found out she was pregnant 2 days ago. No other reported complaints at this time. See nurses notes for past medical/surgical history and complete allergy list. Denies any other associated symptoms at this time.    History provided by: patient      Review of Systems:    Constitutional: No fever or chills.   Skin: No rashes or lesions. No diaphoresis.  HENT: No sore throat, ear pain, or difficulty swallowing.  Eyes: No vision changes, redness, or discharge.  Cardio: No chest pain or palpitations.  Respiratory: No cough, wheezing or SOB.  GI:  No nausea or vomiting. No diarrhea or constipation. No abdominal pain.  GU:  No dysuria, hematuria, or polyuria.  MSK: No joint pain. No neck or back pain.  Neuro: No numbness, tingling, or weakness. No headache.  Psych: +SI with no plan. +depressed.  All other systems reviewed and are negative, unless commented on in the HPI.     Below information reviewed with patient:   Current Outpatient Medications   Medication Sig    cariprazine HCl (VRAYLAR ORAL) Take by mouth    hydrOXYzine pamoate (VISTARIL) 25 mg Oral Capsule Take 1 Capsule (25 mg total) by  mouth Three times a day as needed for Itching or Anxiety       No Known Allergies       Past Medical History:  Past Medical History:   Diagnosis Date    Bipolar 1 disorder (CMS HCC)     PTSD (post-traumatic stress disorder)        Past Surgical History:  History reviewed. No pertinent surgical history.    Social History:  Social History     Tobacco Use    Smoking status: Never Smoker    Smokeless tobacco: Never Used   Substance Use Topics    Alcohol use: Never    Drug use: Never     Social History     Substance and Sexual Activity   Drug Use Never       Family History:  No family history on file.      Old records reviewed.      Objective:  Nursing notes reviewed    Filed Vitals:    05/09/21 2015 05/09/21 2324 05/10/21 0003   BP: 137/77 125/82 122/78   Pulse: (!) 119 (!) 107 92   Resp: 18 16 17    Temp: 36.2 C (97.1 F)     SpO2: 100%  99%     Physical Exam  Nursing note and vitals reviewed.  Vital signs reviewed as above.     Constitutional: Teary eyed. Pt is well-developed and  well-nourished.   Head: Normocephalic and atraumatic.   Eyes: Conjunctivae are normal. Pupils are equal, round, and reactive to light. EOM are intact  Neck: Soft, supple, full range of motion.  Cardiovascular: RRR.  No Murmurs/rubs/gallops. Distal pulses present and equal bilaterally.  Pulmonary/Chest: Normal BS BL with no distress. No audible wheezes or crackles are noted.  Abdominal: Soft, nontender, nondistended. No rebound, guarding, or masses.  Musculoskeletal: Normal range of motion. No deformities.  Exhibits no edema and no tenderness.   Neurological: CNs 2-12 grossly intact. No focal deficits noted.  Skin: Warm and dry. No rash or lesions.  Psychiatric: SI with no plan. Teary eyed.     Work-up:  Orders Placed This Encounter    CBC    COMPREHENSIVE METABOLIC PANEL, NON-FASTING    ETHANOL, SERUM    DRUG SCREEN, WITH CONFIRMATION, URINE    URINALYSIS, MACROSCOPIC    HCG, URINE QUALITATIVE, PREGNANCY    ECG 12 LEAD    diphenhydrAMINE  (BENADRYL) capsule      Labs:  Results for orders placed or performed during the hospital encounter of 05/09/21 (from the past 24 hour(s))   CBC   Result Value Ref Range    WBC 7.9 3.7 - 11.0 x103/uL    RBC 4.43 3.85 - 5.22 x106/uL    HGB 13.8 11.5 - 16.0 g/dL    HCT 62.8 31.5 - 17.6 %    MCV 91.9 78.0 - 100.0 fL    MCH 31.2 26.0 - 32.0 pg    MCHC 33.9 31.0 - 35.5 g/dL    RDW-CV 16.0 73.7 - 10.6 %    PLATELETS 210 150 - 400 x103/uL    MPV 9.5 8.7 - 12.5 fL   COMPREHENSIVE METABOLIC PANEL, NON-FASTING   Result Value Ref Range    SODIUM 138 136 - 145 mmol/L    POTASSIUM 3.7 3.5 - 5.1 mmol/L    CHLORIDE 109 96 - 111 mmol/L    CO2 TOTAL 21 (L) 22 - 30 mmol/L    ANION GAP 8 4 - 13 mmol/L    BUN 7 (L) 8 - 25 mg/dL    CREATININE 2.69 4.85 - 1.05 mg/dL    BUN/CREA RATIO 9 6 - 22    ESTIMATED GFR >90 >=60 mL/min/BSA    ALBUMIN 3.6 3.5 - 5.0 g/dL     CALCIUM 9.2 8.5 - 46.2 mg/dL    GLUCOSE 703 65 - 500 mg/dL    ALKALINE PHOSPHATASE 70 40 - 110 U/L    ALT (SGPT) 13 8 - 22 U/L    AST (SGOT)  14 8 - 45 U/L    BILIRUBIN TOTAL 0.6 0.3 - 1.3 mg/dL    PROTEIN TOTAL 6.8 6.4 - 8.3 g/dL   ETHANOL, SERUM   Result Value Ref Range    ETHANOL None Detected    DRUG SCREEN, WITH CONFIRMATION, URINE   Result Value Ref Range    AMPHETAMINES, URINE Negative Negative    BARBITURATES URINE Negative Negative    BENZODIAZEPINES URINE Negative Negative    BUPRENORPHINE URINE Negative Negative    CANNABINOIDS URINE Negative Negative    COCAINE METABOLITES URINE Negative Negative    METHADONE URINE Negative Negative    OPIATES URINE (LOW CUTOFF) Negative Negative    OXYCODONE URINE Negative Negative    ECSTASY/MDMA URINE Negative Negative    FENTANYL, RANDOM URINE Negative Negative    CREATININE RANDOM URINE 184 (H) 50 - 100 mg/dL   URINALYSIS, MACROSCOPIC   Result Value  Ref Range    COLOR Yellow Colorless, Straw, Yellow    APPEARANCE Clear Clear    SPECIFIC GRAVITY 1.025 >1.005 - <1.030    PH 6.5 >5.0 - <8.0    PROTEIN Not Detected Not Detected  mg/dL    GLUCOSE Not Detected Not Detected mg/dL    KETONES Not Detected Not Detected mg/dL    UROBILINOGEN 1.0 (A) Not Detected, 0.2  mg/dL    BILIRUBIN Not Detected Not Detected mg/dL    BLOOD Not Detected Not Detected mg/dL    NITRITE Not Detected Not Detected    LEUKOCYTES Not Detected Not Detected WBCs/uL   HCG, URINE QUALITATIVE, PREGNANCY   Result Value Ref Range    HCG URINE QUALITATIVE Not detected Not detected     Abnormal Lab results:  Labs Reviewed   COMPREHENSIVE METABOLIC PANEL, NON-FASTING - Abnormal; Notable for the following components:       Result Value    CO2 TOTAL 21 (*)     BUN 7 (*)     All other components within normal limits   DRUG SCREEN, WITH CONFIRMATION, URINE - Abnormal; Notable for the following components:    CREATININE RANDOM URINE 184 (*)     All other components within normal limits   URINALYSIS, MACROSCOPIC - Abnormal; Notable for the following components:    UROBILINOGEN 1.0 (*)     All other components within normal limits   CBC - Normal   HCG, URINE QUALITATIVE, PREGNANCY - Normal   ETHANOL, SERUM     Most Recent EKG This Encounter   ECG 12 LEAD    Collection Time: 05/09/21  9:13 PM   Result Value    Ventricular rate 92    Atrial Rate 92    PR Interval 152    QRS Duration 100    QT Interval 350    QTC Calculation 432    Calculated P Axis 67    Calculated R Axis 18    Calculated T Axis 42    Narrative    Normal sinus rhythm  Normal ECG  NO STEMI  REVIEWED BY DR. Carmie Kanner [id number]     Confirmed by Carmie Kanner ([id number]), editor Margo Aye, Rachael ([id number]) on 05/09/2021 10:49:30 PM     Date/Time ECG Read: 54\09\ 2022 21:13    Plan: Appropriate labs ordered. Medical Records reviewed.    MDM:   During the patient's stay in the emergency department, the above listed imaging and/or labs were performed to assist with medical decision making and were reviewed by myself when available for review.     Pt remained stable throughout the emergency department course.    ED Course:      Patient  called out and stated that she wanted her discharge papers so she could leave.     Consults:   [facility] - Evaluated patient. She does not meet admission criteria. Attempted to get patient accepted to [facility], but they do not accept patient's [facility].     Impression:   Diagnosis       Diagnosis Comment Added By Time Added    History of depression  Merceda Elks, DO 05/10/2021 12:03 AM          Disposition:  Discharged    It was advised that the patient return to the ED with any new, concerning or worsening symptoms and follow up as directed.   The patient verbalized understanding of all instructions and had no further questions or concerns.  Follow up:   [facility]  [address]  [place] [id number]  [phone]    In 1 day      I am scribing for, and in the presence of, Dr. Merceda ElksJoseph Weslyn Holsonback for services provided on 05/09/2021.  Rachael Hescht, SCRIBE     Rachael Hescht, SCRIBE  05/09/2021, 20:20    I personally performed the services described in this documentation, as scribed in my presence, and it is both accurate and complete   Merceda ElksJoseph Denisa Enterline, DO   Emergency Medicine

## 2021-05-09 NOTE — Behavioral Health (Addendum)
Contacted [facility] to look for a bed for patient. Awaiting call back with information.    [facility] returned call, and stated that they do not accept patient's [facility] Medicaid.

## 2021-05-09 NOTE — ED Nurses Note (Signed)
[facility] nurse in to talk to patient at this time.

## 2021-05-09 NOTE — Behavioral Health (Addendum)
[place] Intake & Screening      Date:05/09/2021  Lydia Salazar, 25 y.o., female  Date of Birth: June 03, 1996  Address:   Phone number:   Home Phone [phone]   Work Phone [phone]   Mobile [phone]     Emergency contact: Extended Emergency Contact Information  Primary Emergency Contact: [place]  Mobile Phone: [phone]  Relation: Significant other  Emergency contact phone number:   Occupation:  Marital Status:Single  PCP: No Pcp    Insurance Information:  [employer] conservation officer History:   [facility] Benefits:     Legal Status:Voluntary  Legal Charges(if Yes explain):no    PRESENTING PROBLEM: Patient presented to ED with SI with no plan. Patient denied HI, and auditory/visual hallucinations. She stated that she has been "lashing out", and stated that she doesn't understand why she is so angry sometimes. Patient is currently seen in an outpatient setting, and is prescribed medications by [facility]. Patient stated that the outpatient is "not really working right now". She has no previous psych admissions.    REPORTED SYMPTOMS(sleep, appetite, energy, reckless, impulsive, anxiety etc):anxiety, depression, suicidal ideation    Suicidal: Ideation  Recent Attempt/Gesture(when/what):  Access to Availability:  History of Suicide Behavior(Explain): no  Has the patient managed to avoid suicidal acting out in the past: yes  Family History of Suicide: none reported   If yes, then who and when:     Violence Risk: Denies  Identified Target:  History or Violence(describe):  Self-Injurious Behavior (what/how often/last time):    Legal History(Current Status/ arrests/ convictions):none reported      PSYCHOSIS:No                          If Yes to any describe:    SUBSTANCE ABUSE HISTORY: denies use of any substances        Substance   Duration/Pattern of use       Amount Used       Last Use                                                                                       HISTORY  OF:  Substance Related Legal Issues:none reported    MEDICAL ISSUES/HISTORY:   Ambulation:Independent    Current Lab Abnormalities:  Pregnancy test: negative  Drug Screen Positives: negative  BAL: negative  Current Vital Signs:  Filed Vitals:    05/09/21 2015   BP: 137/77   Pulse: (!) 119   Resp: 18   Temp: 36.2 C (97.1 F)   SpO2: 100%       HISTORY OF MRSA/VRE:no   If yes, site: , treatment:     MEDICAL HISTORY/ACTIVE MEDICAL PROBLEMS:  Past Medical History:   Diagnosis Date   . Bipolar 1 disorder (CMS [facility])    . PTSD (post-traumatic stress disorder)                MEDICATIONS:  Pharmacy:   Preferred Pharmacy     None        No Known Allergies   Medications Prior to Admission  Prescriptions    cariprazine HCl (VRAYLAR ORAL)    Take by mouth    hydrOXYzine pamoate (VISTARIL) 25 mg Oral Capsule    Take 1 Capsule (25 mg total) by mouth Three times a day as needed for Itching or Anxiety           Prescribers of [place]:    Attitude:Cooperative  Eye contact:Consistent  Self concept:Intact  ADL's/Self care:Good  Consciousness: Alert  Appearance:Appropriate  Orientation:Time, Place, Person and Situation  Speech:Regular/Rate/Rhythm  Mood:Depressed and Anxious  Affect:Appropriate  Thought:Relevant/Coherent  Memory:WNL  Judgement/Insight:Fair  Perception:WNL  Motor activity:WNL      PREVIOUS PSYCHIATRIC HISTORY:   Previous History:Yes           If  Yes Diagnosis: bipolar, PTSD    Previous Inpatient Admissions/commitments: No            Facility               Date                 Reason                      OUTPATIENT PROVIDER: [facility]  Active: yes    Severity Risk:Low    Disposition: Initial Diagnostic Impression(Per Physician):   Physician/s Consulted   Accepted to:  Admit to Dr.:  Refer for medical clearance:  Refer for involuntary commitment:  Referred to Outpatient:   Referred other Inpatient:       Where:  Denied(if denied,  reason):  Admission Criteria Not Met:  Other/Comments: [facility] denied patient due to [facility]. Referred patient to follow up with outpatient provider.        [place], MHS   05/09/2021

## 2021-05-10 NOTE — ED Nurses Note (Signed)
Pt left prior to receiving discharge papers.

## 2022-02-14 ENCOUNTER — Encounter (HOSPITAL_COMMUNITY): Payer: Self-pay
